# Patient Record
Sex: Female | Born: 1940 | Race: Black or African American | Hispanic: No | Smoking: Former smoker
Health system: Southern US, Community
[De-identification: ages and names within clinical notes are randomized; demographics above are authoritative.]

## PROBLEM LIST (undated history)

## (undated) DIAGNOSIS — E785 Hyperlipidemia, unspecified: Secondary | ICD-10-CM

## (undated) DIAGNOSIS — E559 Vitamin D deficiency, unspecified: Secondary | ICD-10-CM

## (undated) HISTORY — PX: TONSILLECTOMY: SUR1361

## (undated) HISTORY — PX: ABDOMINAL HYSTERECTOMY: SHX81

## (undated) HISTORY — PX: APPENDECTOMY: SHX54

---

## 2019-06-07 ENCOUNTER — Emergency Department: Payer: Medicare HMO

## 2019-06-07 ENCOUNTER — Other Ambulatory Visit: Payer: Self-pay

## 2019-06-07 ENCOUNTER — Emergency Department
Admission: EM | Admit: 2019-06-07 | Discharge: 2019-06-07 | Disposition: A | Discharge status: Home / Self Care | Payer: Medicare HMO | Attending: Emergency Medicine | Admitting: Emergency Medicine

## 2019-06-07 ENCOUNTER — Encounter: Payer: Self-pay | Admitting: Emergency Medicine

## 2019-06-07 DIAGNOSIS — M546 Pain in thoracic spine: Secondary | ICD-10-CM | POA: Diagnosis not present

## 2019-06-07 DIAGNOSIS — Y92410 Unspecified street and highway as the place of occurrence of the external cause: Secondary | ICD-10-CM | POA: Diagnosis not present

## 2019-06-07 DIAGNOSIS — R109 Unspecified abdominal pain: Secondary | ICD-10-CM | POA: Insufficient documentation

## 2019-06-07 DIAGNOSIS — R079 Chest pain, unspecified: Secondary | ICD-10-CM | POA: Diagnosis not present

## 2019-06-07 DIAGNOSIS — M542 Cervicalgia: Secondary | ICD-10-CM | POA: Diagnosis not present

## 2019-06-07 DIAGNOSIS — Z87891 Personal history of nicotine dependence: Secondary | ICD-10-CM | POA: Insufficient documentation

## 2019-06-07 DIAGNOSIS — M25531 Pain in right wrist: Secondary | ICD-10-CM | POA: Insufficient documentation

## 2019-06-07 MED ORDER — CYCLOBENZAPRINE HCL 10 MG PO TABS
10.0000 mg | ORAL_TABLET | Freq: Once | ORAL | Status: AC
  Administered 2019-06-07: 18:00:00 10 mg via ORAL
  Filled 2019-06-07: qty 1

## 2019-06-07 MED ORDER — ACETAMINOPHEN 325 MG PO TABS
650.0000 mg | ORAL_TABLET | Freq: Once | ORAL | Status: AC
  Administered 2019-06-07: 650 mg via ORAL
  Filled 2019-06-07: qty 2

## 2019-06-07 MED ORDER — TRAMADOL HCL 50 MG PO TABS: 50.0000 mg | ORAL_TABLET | Freq: Four times a day (QID) | ORAL | 0 refills | Status: DC | PRN
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
## 2019-06-07 NOTE — ED Provider Notes (Signed)
Procedures     ----------------------------------------- 9:05 PM on 06/07/2019 -----------------------------------------  CT lumbar spine overall unremarkable, impression as follows IMPRESSION: 1. No acute osseous abnormality. 2. Grade 1 anterolisthesis L4 on L5 likely on a facet degenerative basis without associated spondylolysis/pars defect. 3. Multilevel degenerative changes of the lumbar spine as described above, including mild canal stenosis at L4-L5 and moderate bilateral foraminal narrowing. Milder changes noted elsewhere in the lumbar spine as above. No severe canal or foraminal stenosis. 4. A mild bilateral SI joint arthrosis. 5. Aortic Atherosclerosis (ICD10-I70.0).   Stable for discharge home as per Dr. Lucilla Lame plan.    Carrie Mew, MD 06/07/19 2106
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
## 2019-06-09 ENCOUNTER — Ambulatory Visit
Admission: EM | Admit: 2019-06-09 | Discharge: 2019-06-09 | Disposition: A | Discharge status: Home / Self Care | Payer: Medicare HMO | Attending: Family Medicine | Admitting: Family Medicine

## 2019-06-09 ENCOUNTER — Encounter: Payer: Self-pay | Admitting: Emergency Medicine

## 2019-06-09 ENCOUNTER — Other Ambulatory Visit: Payer: Self-pay

## 2019-06-09 DIAGNOSIS — T50905A Adverse effect of unspecified drugs, medicaments and biological substances, initial encounter: Secondary | ICD-10-CM | POA: Diagnosis not present

## 2019-06-09 HISTORY — DX: Hyperlipidemia, unspecified: E78.5

## 2019-06-09 HISTORY — DX: Vitamin D deficiency, unspecified: E55.9

## 2019-06-09 MED ORDER — ONDANSETRON 4 MG PO TBDP
4.0000 mg | ORAL_TABLET | Freq: Once | ORAL | Status: AC
  Administered 2019-06-09: 19:00:00 4 mg via ORAL

## 2019-06-09 MED ORDER — ONDANSETRON HCL 4 MG PO TABS: 4.0000 mg | ORAL_TABLET | Freq: Three times a day (TID) | ORAL | 0 refills | Status: AC | PRN

## 2019-06-09 MED ORDER — ONDANSETRON HCL 4 MG PO TABS: 4.0000 mg | ORAL_TABLET | Freq: Three times a day (TID) | ORAL | 0 refills | Status: DC | PRN
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
## 2019-06-09 NOTE — Discharge Instructions (Signed)
Medication as needed for nausea.  Tylenol 1000 mg 3 times daily as needed for pain.  Take care  Dr. Lacinda Axon

## 2019-06-12 ENCOUNTER — Other Ambulatory Visit: Payer: Self-pay

## 2019-06-12 ENCOUNTER — Ambulatory Visit
Admission: EM | Admit: 2019-06-12 | Discharge: 2019-06-12 | Disposition: A | Discharge status: Home / Self Care | Payer: Medicare HMO | Attending: Family Medicine | Admitting: Family Medicine

## 2019-06-12 DIAGNOSIS — K5903 Drug induced constipation: Secondary | ICD-10-CM | POA: Diagnosis not present

## 2019-06-12 MED ORDER — POLYETHYLENE GLYCOL 3350 17 GM/SCOOP PO POWD: ORAL | 0 refills | Status: AC

## 2019-06-12 NOTE — ED Triage Notes (Signed)
Patient states that she has been having constipation since yesterday and started noticing bright red blood and states that it is very painful to pass stool.
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
## 2019-06-12 NOTE — Discharge Instructions (Signed)
Miralax as directed.  If you need faster relief, try over the counter mag citrate.  Take care  Dr. Lacinda Axon
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
